# Patient Record
Sex: Male | Born: 1985 | Race: Black or African American | Hispanic: No | Marital: Single | Smoking: Former smoker
Health system: Southern US, Community
[De-identification: ages and names within clinical notes are randomized; demographics above are authoritative.]

## PROBLEM LIST (undated history)

## (undated) DIAGNOSIS — B977 Papillomavirus as the cause of diseases classified elsewhere: Secondary | ICD-10-CM

## (undated) DIAGNOSIS — I10 Essential (primary) hypertension: Secondary | ICD-10-CM

## (undated) DIAGNOSIS — J45909 Unspecified asthma, uncomplicated: Secondary | ICD-10-CM

## (undated) DIAGNOSIS — B20 Human immunodeficiency virus [HIV] disease: Secondary | ICD-10-CM

## (undated) DIAGNOSIS — D849 Immunodeficiency, unspecified: Secondary | ICD-10-CM

## (undated) DIAGNOSIS — I639 Cerebral infarction, unspecified: Secondary | ICD-10-CM

## (undated) DIAGNOSIS — I509 Heart failure, unspecified: Secondary | ICD-10-CM

---

## 2018-04-23 ENCOUNTER — Emergency Department (HOSPITAL_COMMUNITY): Payer: Medicaid Other

## 2018-04-23 ENCOUNTER — Encounter (HOSPITAL_COMMUNITY): Payer: Self-pay

## 2018-04-23 ENCOUNTER — Other Ambulatory Visit: Payer: Self-pay

## 2018-04-23 ENCOUNTER — Emergency Department (HOSPITAL_COMMUNITY)
Admission: EM | Admit: 2018-04-23 | Discharge: 2018-04-24 | Disposition: A | Discharge status: Home / Self Care | Payer: Medicaid Other | Attending: Emergency Medicine | Admitting: Emergency Medicine

## 2018-04-23 DIAGNOSIS — Z8673 Personal history of transient ischemic attack (TIA), and cerebral infarction without residual deficits: Secondary | ICD-10-CM | POA: Insufficient documentation

## 2018-04-23 DIAGNOSIS — F17210 Nicotine dependence, cigarettes, uncomplicated: Secondary | ICD-10-CM | POA: Insufficient documentation

## 2018-04-23 DIAGNOSIS — Z21 Asymptomatic human immunodeficiency virus [HIV] infection status: Secondary | ICD-10-CM | POA: Insufficient documentation

## 2018-04-23 DIAGNOSIS — I509 Heart failure, unspecified: Secondary | ICD-10-CM | POA: Insufficient documentation

## 2018-04-23 DIAGNOSIS — I110 Hypertensive heart disease with heart failure: Secondary | ICD-10-CM | POA: Insufficient documentation

## 2018-04-23 DIAGNOSIS — R079 Chest pain, unspecified: Secondary | ICD-10-CM | POA: Diagnosis present

## 2018-04-23 DIAGNOSIS — R0789 Other chest pain: Secondary | ICD-10-CM | POA: Diagnosis not present

## 2018-04-23 DIAGNOSIS — J45909 Unspecified asthma, uncomplicated: Secondary | ICD-10-CM | POA: Insufficient documentation

## 2018-04-23 DIAGNOSIS — R112 Nausea with vomiting, unspecified: Secondary | ICD-10-CM | POA: Diagnosis not present

## 2018-04-23 HISTORY — DX: Immunodeficiency, unspecified: D84.9

## 2018-04-23 HISTORY — DX: Essential (primary) hypertension: I10

## 2018-04-23 HISTORY — DX: Cerebral infarction, unspecified: I63.9

## 2018-04-23 HISTORY — DX: Human immunodeficiency virus (HIV) disease: B20

## 2018-04-23 HISTORY — DX: Unspecified asthma, uncomplicated: J45.909

## 2018-04-23 HISTORY — DX: Papillomavirus as the cause of diseases classified elsewhere: B97.7

## 2018-04-23 HISTORY — DX: Heart failure, unspecified: I50.9

## 2018-04-23 LAB — CBC WITH DIFFERENTIAL/PLATELET
Abs Immature Granulocytes: 0.01 10*3/uL (ref 0.00–0.07)
Basophils Absolute: 0 10*3/uL (ref 0.0–0.1)
Basophils Relative: 1 %
Eosinophils Absolute: 0 10*3/uL (ref 0.0–0.5)
Eosinophils Relative: 0 %
HCT: 37 % — ABNORMAL LOW (ref 39.0–52.0)
Hemoglobin: 11.9 g/dL — ABNORMAL LOW (ref 13.0–17.0)
Immature Granulocytes: 0 %
Lymphocytes Relative: 41 %
Lymphs Abs: 1.7 10*3/uL (ref 0.7–4.0)
MCH: 27.8 pg (ref 26.0–34.0)
MCHC: 32.2 g/dL (ref 30.0–36.0)
MCV: 86.4 fL (ref 80.0–100.0)
MONOS PCT: 6 %
Monocytes Absolute: 0.2 10*3/uL (ref 0.1–1.0)
Neutro Abs: 2.2 10*3/uL (ref 1.7–7.7)
Neutrophils Relative %: 52 %
PLATELETS: 231 10*3/uL (ref 150–400)
RBC: 4.28 MIL/uL (ref 4.22–5.81)
RDW: 13.8 % (ref 11.5–15.5)
WBC: 4.2 10*3/uL (ref 4.0–10.5)
nRBC: 0 % (ref 0.0–0.2)

## 2018-04-23 LAB — BASIC METABOLIC PANEL
Anion gap: 11 (ref 5–15)
BUN: 8 mg/dL (ref 6–20)
CO2: 23 mmol/L (ref 22–32)
Calcium: 9.9 mg/dL (ref 8.9–10.3)
Chloride: 105 mmol/L (ref 98–111)
Creatinine, Ser: 1.1 mg/dL (ref 0.61–1.24)
GFR calc Af Amer: >60 mL/min (ref >60)
GFR calc non Af Amer: >60 mL/min (ref >60)
Glucose, Bld: 75 mg/dL (ref 70–99)
Potassium: 3.3 mmol/L — ABNORMAL LOW (ref 3.5–5.1)
Sodium: 139 mmol/L (ref 135–145)

## 2018-04-23 LAB — TROPONIN I: Troponin I: <0.03 ng/mL (ref <0.03)

## 2018-04-23 MED ORDER — APIXABAN 5 MG PO TABS
5.0000 mg | ORAL_TABLET | Freq: Once | ORAL | Status: AC
  Administered 2018-04-24: 5 mg via ORAL
  Filled 2018-04-23: qty 1

## 2018-04-23 MED ORDER — LOSARTAN POTASSIUM 50 MG PO TABS
25.0000 mg | ORAL_TABLET | Freq: Once | ORAL | Status: AC
  Administered 2018-04-23: 25 mg via ORAL
  Filled 2018-04-23: qty 1

## 2018-04-23 MED ORDER — IBUPROFEN 800 MG PO TABS
800.0000 mg | ORAL_TABLET | Freq: Once | ORAL | Status: AC
  Administered 2018-04-23: 800 mg via ORAL
  Filled 2018-04-23: qty 1

## 2018-04-23 MED ORDER — ASPIRIN EC 81 MG PO TBEC
81.0000 mg | DELAYED_RELEASE_TABLET | Freq: Once | ORAL | Status: AC
  Administered 2018-04-23: 81 mg via ORAL
  Filled 2018-04-23: qty 1

## 2018-04-23 MED ORDER — IBUPROFEN 800 MG PO TABS: 800.0000 mg | ORAL_TABLET | Freq: Three times a day (TID) | ORAL | 0 refills | Status: AC | PRN

## 2018-04-23 MED ORDER — SODIUM CHLORIDE 0.9 % IV BOLUS
1000.0000 mL | Freq: Once | INTRAVENOUS | Status: AC
  Administered 2018-04-23: 1000 mL via INTRAVENOUS

## 2018-04-23 MED ORDER — TRAZODONE HCL 50 MG PO TABS
150.0000 mg | ORAL_TABLET | Freq: Every day | ORAL | Status: DC
  Administered 2018-04-23: 150 mg via ORAL
  Filled 2018-04-23: qty 3

## 2018-04-23 MED ORDER — NALOXONE HCL 2 MG/2ML IJ SOSY
1.0000 mg | PREFILLED_SYRINGE | Freq: Once | INTRAMUSCULAR | Status: AC
  Administered 2018-04-23: 1 mg via INTRAVENOUS
  Filled 2018-04-23: qty 2

## 2018-04-23 MED ORDER — METOPROLOL TARTRATE 25 MG PO TABS
50.0000 mg | ORAL_TABLET | Freq: Once | ORAL | Status: AC
  Administered 2018-04-23: 50 mg via ORAL
  Filled 2018-04-23: qty 2

## 2018-04-23 NOTE — Discharge Instructions (Addendum)
Follow up if not improving

## 2018-04-23 NOTE — ED Provider Notes (Signed)
[facility] EMERGENCY DEPARTMENT Provider Note   CSN: [id number] Arrival date & time: 04/23/18  1951    History   Chief Complaint Chief Complaint  Patient presents with  . Chest Pain    HPI Tyshaun Bilbrey is a 33 y.o. male.     Patient complains of chest discomfort.  He has no cough no fever.  The history is provided by the patient. No language interpreter was used.  Chest Pain  Pain location:  Substernal area Pain quality: aching   Pain radiates to:  Does not radiate Pain severity:  Moderate Onset quality:  Sudden Timing:  Constant Progression:  Worsening Chronicity:  New Context: not breathing   Relieved by:  Nothing Exacerbated by: Movement. Associated symptoms: no abdominal pain, no back pain, no cough, no fatigue and no headache     Past Medical History:  Diagnosis Date  . Asthma   . CHF (congestive heart failure) (HCC)   . HIV (human immunodeficiency virus infection) (HCC)   . HPV (human papilloma virus) infection   . Hypertension   . Immune deficiency disorder (HCC)   . Stroke [facility])     There are no active problems to display for this patient.   History reviewed. No pertinent surgical history.      Home Medications    Prior to Admission medications   Medication Sig Start Date End Date Taking? Authorizing Provider  ibuprofen (ADVIL,MOTRIN) 800 MG tablet Take 1 tablet (800 mg total) by mouth every 8 (eight) hours as needed for moderate pain. 04/23/18   Bethann Berkshire, MD    Family History History reviewed. No pertinent family history.  Social History Social History   Tobacco Use  . Smoking status: Current Every Day Smoker    Packs/day: 0.50    Years: 15.00    Pack years: 7.50    Types: Cigarettes  . Smokeless tobacco: Never Used  Substance Use Topics  . Alcohol use: Not Currently  . 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   Review of  Systems Review of Systems  Constitutional: Negative for appetite change and fatigue.  HENT: Negative for congestion, ear discharge and sinus pressure.   Eyes: Negative for discharge.  Respiratory: Negative for cough.   Cardiovascular: Positive for chest pain.  Gastrointestinal: Negative for abdominal pain and diarrhea.  Genitourinary: Negative for frequency and hematuria.  Musculoskeletal: Negative for back pain.  Skin: Negative for rash.  Neurological: Negative for seizures and headaches.  Psychiatric/Behavioral: Negative for hallucinations.     Physical Exam Updated Vital Signs BP 111/67   Pulse 94   Temp 98.4 F (36.9 C) (Oral)   Resp 15   Ht 5\' 8"  (1.727 m)   Wt 81.6 kg   SpO2 (!) 69%   BMI 27.37 kg/m   Physical Exam Vitals signs and nursing note reviewed.  Constitutional:      Appearance: He is well-developed.  HENT:     Head: Normocephalic.     Nose: Nose normal.  Eyes:     General: No scleral icterus.    Conjunctiva/sclera: Conjunctivae normal.  Neck:     Musculoskeletal: Neck supple.     Thyroid: No thyromegaly.  Cardiovascular:     Rate and Rhythm: Normal rate and regular rhythm.     Heart sounds: No murmur. No friction rub. No gallop.   Pulmonary:     Breath sounds: No stridor.  No wheezing or rales.  Chest:     Chest wall: Tenderness present.  Abdominal:     General: There is no distension.     Tenderness: There is no abdominal tenderness. There is no rebound.  Musculoskeletal: Normal range of motion.  Lymphadenopathy:     Cervical: No cervical adenopathy.  Skin:    Findings: No erythema or rash.  Neurological:     Mental Status: He is alert and oriented to person, place, and time.     Motor: No abnormal muscle tone.     Coordination: Coordination normal.  Psychiatric:        Behavior: Behavior normal.      ED Treatments / Results  Labs (all labs ordered are listed, but only abnormal results are displayed) Labs Reviewed  CBC WITH  DIFFERENTIAL/PLATELET - Abnormal; Notable for the following components:      Result Value   Hemoglobin 11.9 (*)    HCT 37.0 (*)    All other components within normal limits  BASIC METABOLIC PANEL - Abnormal; Notable for the following components:   Potassium 3.3 (*)    All other components within normal limits  TROPONIN I  RAPID URINE DRUG SCREEN, HOSP PERFORMED    EKG EKG Interpretation  Date/Time:  Sunday April 23 2018 20:11:29 EDT Ventricular Rate:  90 PR Interval:    QRS Duration: 97 QT Interval:  362 QTC Calculation: 443 R Axis:   80 Text Interpretation:  Sinus rhythm ST elev, probable normal early repol pattern Confirmed by Oberia Beaudoin ([id number]) on 04/23/2018 10:29:14 PM   Radiology Dg Chest Port 1 View  Result Date: 04/23/2018 CLINICAL DATA:  Chest pain. EXAM: PORTABLE CHEST 1 VIEW COMPARISON:  None. FINDINGS: The heart size and mediastinal contours are within normal limits. Both lungs are clear. The visualized skeletal structures are unremarkable. IMPRESSION: No active disease. Electronically Signed   By: David  Williams III M.D   On: 04/23/2018 21:08    Procedures Procedures (including critical care time)  Medications Ordered in ED Medications  ibuprofen (ADVIL,MOTRIN) tablet 800 mg (800 mg Oral Given 04/23/18 2042)  sodium chloride 0.9 % bolus 1,000 mL (1,000 mLs Intravenous New Bag/Given 04/23/18 2047)  naloxone (NARCAN) injection 1 mg (1 mg Intravenous Given 04/23/18 2042)     Initial Impression / Assessment and Plan / ED Course  I have reviewed the triage vital signs and the nursing notes.  Pertinent labs & imaging results that were available during my care of the patient were reviewed by me and considered in my medical decision making (see chart for details).        Labs EKG troponin all unremarkable.  Chest x-ray normal.  Patient with atypical chest pain will give Motrin and follow-up Final Clinical Impressions(s) / ED Diagnoses   Final diagnoses:   Atypical chest pain    ED Discharge Orders         Ordered    ibuprofen (ADVIL,MOTRIN) 800 MG tablet  Every 8 hours PRN     03 /29/20 2256           Bethann Berkshire, MD 04/23/18 2300

## 2018-04-23 NOTE — ED Triage Notes (Signed)
Pt rode bus here from [place]. Bus drove away with luggage when pt reached [place]. Pt started having panic attack alone with chest pain to left chest radiating to left arm. Numbess and tingling to BUE and BLE. Pt with history of MI and stroke

## 2018-04-24 ENCOUNTER — Emergency Department (HOSPITAL_COMMUNITY)
Admission: EM | Admit: 2018-04-24 | Discharge: 2018-04-24 | Disposition: A | Discharge status: Home / Self Care | Payer: Medicaid Other | Attending: Emergency Medicine | Admitting: Emergency Medicine

## 2018-04-24 ENCOUNTER — Other Ambulatory Visit: Payer: Self-pay

## 2018-04-24 ENCOUNTER — Emergency Department (HOSPITAL_COMMUNITY): Payer: Medicaid Other

## 2018-04-24 ENCOUNTER — Encounter (HOSPITAL_COMMUNITY): Payer: Self-pay | Admitting: Student

## 2018-04-24 DIAGNOSIS — I110 Hypertensive heart disease with heart failure: Secondary | ICD-10-CM | POA: Diagnosis not present

## 2018-04-24 DIAGNOSIS — Z8673 Personal history of transient ischemic attack (TIA), and cerebral infarction without residual deficits: Secondary | ICD-10-CM | POA: Insufficient documentation

## 2018-04-24 DIAGNOSIS — F17210 Nicotine dependence, cigarettes, uncomplicated: Secondary | ICD-10-CM | POA: Insufficient documentation

## 2018-04-24 DIAGNOSIS — B20 Human immunodeficiency virus [HIV] disease: Secondary | ICD-10-CM | POA: Insufficient documentation

## 2018-04-24 DIAGNOSIS — R1030 Lower abdominal pain, unspecified: Secondary | ICD-10-CM | POA: Diagnosis not present

## 2018-04-24 DIAGNOSIS — I509 Heart failure, unspecified: Secondary | ICD-10-CM | POA: Diagnosis not present

## 2018-04-24 DIAGNOSIS — J45909 Unspecified asthma, uncomplicated: Secondary | ICD-10-CM | POA: Insufficient documentation

## 2018-04-24 DIAGNOSIS — Z86718 Personal history of other venous thrombosis and embolism: Secondary | ICD-10-CM | POA: Diagnosis not present

## 2018-04-24 DIAGNOSIS — Z7901 Long term (current) use of anticoagulants: Secondary | ICD-10-CM | POA: Diagnosis not present

## 2018-04-24 DIAGNOSIS — R197 Diarrhea, unspecified: Secondary | ICD-10-CM | POA: Diagnosis not present

## 2018-04-24 DIAGNOSIS — R112 Nausea with vomiting, unspecified: Secondary | ICD-10-CM

## 2018-04-24 LAB — URINALYSIS, COMPLETE (UACMP) WITH MICROSCOPIC
BILIRUBIN URINE: NEGATIVE
Bacteria, UA: NONE SEEN
Glucose, UA: NEGATIVE mg/dL
Hgb urine dipstick: NEGATIVE
Ketones, ur: NEGATIVE mg/dL
Leukocytes,Ua: NEGATIVE
Nitrite: NEGATIVE
Protein, ur: NEGATIVE mg/dL
Specific Gravity, Urine: 1.028 (ref 1.005–1.030)
pH: 6 (ref 5.0–8.0)

## 2018-04-24 LAB — RAPID URINE DRUG SCREEN, HOSP PERFORMED
Amphetamines: POSITIVE — AB
Barbiturates: NOT DETECTED
Benzodiazepines: NOT DETECTED
Cocaine: NOT DETECTED
Opiates: NOT DETECTED
Tetrahydrocannabinol: NOT DETECTED

## 2018-04-24 LAB — CBC WITH DIFFERENTIAL/PLATELET
Abs Immature Granulocytes: 0 10*3/uL (ref 0.00–0.07)
Basophils Absolute: 0 10*3/uL (ref 0.0–0.1)
Basophils Relative: 1 %
Eosinophils Absolute: 0 10*3/uL (ref 0.0–0.5)
Eosinophils Relative: 1 %
HCT: 34.7 % — ABNORMAL LOW (ref 39.0–52.0)
Hemoglobin: 11.4 g/dL — ABNORMAL LOW (ref 13.0–17.0)
Immature Granulocytes: 0 %
Lymphocytes Relative: 33 %
Lymphs Abs: 1.1 10*3/uL (ref 0.7–4.0)
MCH: 29.3 pg (ref 26.0–34.0)
MCHC: 32.9 g/dL (ref 30.0–36.0)
MCV: 89.2 fL (ref 80.0–100.0)
Monocytes Absolute: 0.3 10*3/uL (ref 0.1–1.0)
Monocytes Relative: 8 %
NRBC: 0 % (ref 0.0–0.2)
Neutro Abs: 2 10*3/uL (ref 1.7–7.7)
Neutrophils Relative %: 57 %
Platelets: 204 10*3/uL (ref 150–400)
RBC: 3.89 MIL/uL — ABNORMAL LOW (ref 4.22–5.81)
RDW: 14.1 % (ref 11.5–15.5)
WBC: 3.4 10*3/uL — ABNORMAL LOW (ref 4.0–10.5)

## 2018-04-24 LAB — COMPREHENSIVE METABOLIC PANEL
ALT: 23 U/L (ref 0–44)
AST: 35 U/L (ref 15–41)
Albumin: 3.8 g/dL (ref 3.5–5.0)
Alkaline Phosphatase: 65 U/L (ref 38–126)
Anion gap: 5 (ref 5–15)
BUN: 11 mg/dL (ref 6–20)
CO2: 24 mmol/L (ref 22–32)
CREATININE: 1.13 mg/dL (ref 0.61–1.24)
Calcium: 8.8 mg/dL — ABNORMAL LOW (ref 8.9–10.3)
Chloride: 110 mmol/L (ref 98–111)
GFR calc non Af Amer: >60 mL/min (ref >60)
Glucose, Bld: 104 mg/dL — ABNORMAL HIGH (ref 70–99)
Potassium: 3.7 mmol/L (ref 3.5–5.1)
Sodium: 139 mmol/L (ref 135–145)
Total Bilirubin: 0.6 mg/dL (ref 0.3–1.2)
Total Protein: 6 g/dL — ABNORMAL LOW (ref 6.5–8.1)

## 2018-04-24 LAB — LACTIC ACID, PLASMA
Lactic Acid, Venous: 2.8 mmol/L (ref 0.5–1.9)
Lactic Acid, Venous: 1 mmol/L (ref 0.5–1.9)

## 2018-04-24 LAB — SALICYLATE LEVEL: Salicylate Lvl: <7 mg/dL (ref 2.8–30.0)

## 2018-04-24 LAB — CBG MONITORING, ED: Glucose-Capillary: 93 mg/dL (ref 70–99)

## 2018-04-24 LAB — LACTATE DEHYDROGENASE: LDH: 161 U/L (ref 98–192)

## 2018-04-24 LAB — ACETAMINOPHEN LEVEL: Acetaminophen (Tylenol), Serum: <10 ug/mL — ABNORMAL LOW (ref 10–30)

## 2018-04-24 LAB — ETHANOL

## 2018-04-24 MED ORDER — IOHEXOL 300 MG/ML  SOLN
100.0000 mL | Freq: Once | INTRAMUSCULAR | Status: AC | PRN
  Administered 2018-04-24: 100 mL via INTRAVENOUS

## 2018-04-24 MED ORDER — SODIUM CHLORIDE 0.9 % IV BOLUS
1000.0000 mL | Freq: Once | INTRAVENOUS | Status: AC
  Administered 2018-04-24: 1000 mL via INTRAVENOUS

## 2018-04-24 MED ORDER — DICYCLOMINE HCL 20 MG PO TABS: 20.0000 mg | ORAL_TABLET | Freq: Three times a day (TID) | ORAL | 0 refills | Status: AC | PRN

## 2018-04-24 MED ORDER — ONDANSETRON 4 MG PO TBDP: 4.0000 mg | ORAL_TABLET | Freq: Three times a day (TID) | ORAL | 0 refills | Status: AC | PRN

## 2018-04-24 MED ORDER — ONDANSETRON HCL 4 MG/2ML IJ SOLN
4.0000 mg | Freq: Once | INTRAMUSCULAR | Status: AC
  Administered 2018-04-24: 4 mg via INTRAVENOUS
  Filled 2018-04-24: qty 2

## 2018-04-24 MED ORDER — DICYCLOMINE HCL 10 MG/ML IM SOLN
20.0000 mg | Freq: Once | INTRAMUSCULAR | Status: AC
  Administered 2018-04-24: 20 mg via INTRAMUSCULAR
  Filled 2018-04-24: qty 2

## 2018-04-24 NOTE — ED Notes (Signed)
CBG 93 

## 2018-04-24 NOTE — Discharge Instructions (Signed)
You are seen in the emergency department today for nausea, vomiting, diarrhea, and abdominal pain.  Your work-up was overall reassuring.  Your imaging did not show any specific acute abnormalities.  We are sending you home with Zofran and antinausea medication to help with nausea and vomiting as well as Bentyl a medicine for abdominal discomfort to take as needed for abdominal cramping.  We have prescribed you new medication(s) today. Discuss the medications prescribed today with your pharmacist as they can have adverse effects and interactions with your other medicines including over the counter and prescribed medications. Seek medical evaluation if you start to experience new or abnormal symptoms after taking one of these medicines, seek care immediately if you start to experience difficulty breathing, feeling of your throat closing, facial swelling, or rash as these could be indications of a more serious allergic reaction  Please be sure to take all of your medications as prescribed that were given after discharge from the hospital.  Please be sure to follow-up with primary care within 3 days.  Return to the ER for new or worsening symptoms or any other concerns.

## 2018-04-24 NOTE — ED Notes (Signed)
PT states understanding of care given, follow up care, and medication prescribed. PT ambulated from ED to car with a steady gait. 

## 2018-04-24 NOTE — ED Notes (Signed)
Pt given water to drink per PO challenge order

## 2018-04-24 NOTE — ED Provider Notes (Signed)
[facility] EMERGENCY DEPARTMENT Provider Note   CSN: [id number] Arrival date & time: 04/24/18  0900    History   Chief Complaint Chief Complaint  Patient presents with   Emesis   Abdominal Pain   Shortness of Breath    HPI Loma MessingWillie Iannelli is a 33 y.o. 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 Patient reports he is primarily here for N/V/D with associated abdominal pain which began this AM. He notes 3 episodes of emesis & 4-5 episodes of diarrhea associated with lower abdominal discomfort that is constant. No blood in diarrhea or emesis, no mucous either. No alleviating/aggravating factors. No suspicious PO intake.Marland Kitchen. He notes over the past few hours he has also felt somewhat short of breath which he attributes to an asthma issue. No alleviating/aggravating factors. No associated chest pain, cough, fever, or wheezing. He did recently come from [place]. No known covid 19 exposure. No international travel.   He was recently hospitalized for stroke sxs in [place] 03/25. Available records were reviewed through care everywhere. He states he came to [place] to see his brother and came down sometime this weekend. He is an overall poor historian, disoriented to time & place, oriented to person. History somewhat limited due to AMS. He states not taking his medicines consistently the past couple of days.     HPI  Past Medical History:  Diagnosis Date   Asthma    CHF (congestive heart failure) (HCC)    HIV (human immunodeficiency virus infection) (HCC)    HPV (human papilloma virus) infection    Hypertension    Immune deficiency disorder (HCC)    Stroke (HCC)     There are no active problems to display for this patient.   No past surgical history on file.      Home Medications     Prior to Admission medications   Medication Sig Start Date End Date Taking? Authorizing Provider  ibuprofen (ADVIL,MOTRIN) 800 MG tablet Take 1 tablet (800 mg total) by mouth every 8 (eight) hours as needed for moderate pain. 04/23/18   Bethann BerkshireZammit, Joseph, MD    Family History No family history on file.  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   Review of Systems Review of Systems  Constitutional: Negative for chills and fever.  HENT: Negative for congestion, ear pain and sore throat.   Respiratory: Positive for shortness of breath. Negative for cough and wheezing.   Cardiovascular: Negative for chest pain.  Gastrointestinal: Positive for abdominal pain, diarrhea, nausea and vomiting. Negative for anal bleeding, blood in stool and constipation.  Genitourinary: Negative for dysuria.  Neurological: Negative for syncope.  All other systems reviewed and are negative.  Physical Exam Updated Vital Signs BP (!) 142/88 (BP Location: Right Arm)    Pulse (!) 112    Temp 98.3 F (36.8 C) (Oral)  Resp 18    SpO2 98%   Physical Exam Vitals signs and nursing note reviewed.  Constitutional:      General: He is not in acute distress.    Appearance: He is well-developed. He is not toxic-appearing.  HENT:     Head: Normocephalic and atraumatic.  Eyes:     General:        Right eye: No discharge.        Left eye: No discharge.     Extraocular Movements: Extraocular movements intact.     Conjunctiva/sclera: Conjunctivae normal.  Neck:     Musculoskeletal: Normal range of motion and neck supple. No edema, neck rigidity or crepitus.  Cardiovascular:     Rate  and Rhythm: Normal rate and regular rhythm.  Pulmonary:     Effort: Pulmonary effort is normal. No respiratory distress.     Breath sounds: Normal breath sounds. No wheezing, rhonchi or rales.  Abdominal:     General: There is no distension.     Palpations: Abdomen is soft.     Tenderness: There is abdominal tenderness (lower abdomen). There is no guarding or rebound.  Skin:    General: Skin is warm and dry.     Findings: No rash.  Neurological:     Mental Status: He is alert.     Comments: Clear speech.  CN III through XII grossly intact.  Patient does have some mild weakness to the LUE/LLE in comparison to RUE/RLE, does not appear to be acute today per patient and upon chart review. Negative pronator drift. Normal finger to nose. Ambulatory. Oriented to person (name DOB). In terms of place- states we are in [place], [place]. In terms of time- states it is June 2022 and that Fay Records is the president.   Psychiatric:        Behavior: Behavior normal.      ED Treatments / Results  Labs (all labs ordered are listed, but only abnormal results are displayed) Labs Reviewed  COMPREHENSIVE METABOLIC PANEL - Abnormal; Notable for the following components:      Result Value   Glucose, Bld 104 (*)    Calcium 8.8 (*)    Total Protein 6.0 (*)    All other components within normal limits  CBC WITH DIFFERENTIAL/PLATELET - Abnormal; Notable for the following components:   WBC 3.4 (*)    RBC 3.89 (*)    Hemoglobin 11.4 (*)    HCT 34.7 (*)    All other components within normal limits  URINALYSIS, COMPLETE (UACMP) WITH MICROSCOPIC - Abnormal; Notable for the following components:   APPearance HAZY (*)    All other components within normal limits  LACTIC ACID, PLASMA - Abnormal; Notable for the following components:   Lactic Acid, Venous 2.8 (*)    All other components within normal limits  RAPID URINE DRUG SCREEN, HOSP PERFORMED - Abnormal; Notable for the following components:   Amphetamines  POSITIVE (*)    All other components within normal limits  ACETAMINOPHEN LEVEL - Abnormal; Notable for the following components:   Acetaminophen (Tylenol), Serum <10 (*)    All other components within normal limits  CULTURE, BLOOD (ROUTINE X 2)  CULTURE, BLOOD (ROUTINE X 2)  URINE CULTURE  ETHANOL  LACTATE DEHYDROGENASE  SALICYLATE LEVEL  LACTIC ACID, PLASMA  LACTIC ACID, PLASMA  CBG MONITORING, ED    EKG None  Radiology Ct Head Wo Contrast  Result Date: 04/24/2018 CLINICAL DATA:  Altered mental status. EXAM: CT HEAD WITHOUT CONTRAST  TECHNIQUE: Contiguous axial images were obtained from the base of the skull through the vertex without intravenous contrast. COMPARISON:  None. FINDINGS: Brain: No evidence of acute infarction, hemorrhage, hydrocephalus, extra-axial collection or mass lesion/mass effect. Vascular: No hyperdense vessel or unexpected calcification. Skull: Normal. Negative for fracture or focal lesion. Sinuses/Orbits: No acute finding. Other: None. IMPRESSION: Normal head CT. Electronically Signed   By: Lupita Raider, M.D.   On: 04/24/2018 12:18   Ct Abdomen Pelvis W Contrast  Result Date: 04/24/2018 CLINICAL DATA:  Pt c/o sob and vomiting x 3 that began this am; denies sore throat, cough; endorses CP yesterday but not today; endorses diarrhea x 5 today accompanied by lower abdominal pain 8/10; pt from [place], pt states he arrived in [place] this past Saturday; alert and oriented to self, place, situation; disoriented to time ; pt states he has not been taking his meds; denies drug/etoh use EXAM: CT ABDOMEN AND PELVIS WITH CONTRAST TECHNIQUE: Multidetector CT imaging of the abdomen and pelvis was performed using the standard protocol following bolus administration of intravenous contrast. CONTRAST:  OMNIPAQUE IOHEXOL 300 MG/ML  SOLN COMPARISON:  None. FINDINGS: Lower chest: Clear lung bases.  Heart normal in size. Hepatobiliary: No focal liver abnormality is seen. No gallstones,  gallbladder wall thickening, or biliary dilatation. Pancreas: Unremarkable. No pancreatic ductal dilatation or surrounding inflammatory changes. Spleen: Normal in size without focal abnormality. Adrenals/Urinary Tract: No adrenal masses. Kidneys are normal in size, orientation and position. No renal masses or convincing stones. No hydronephrosis. Normal ureters. Bladder is mildly distended. Wall is prominent, most likely due to lack of distension. Consider cystitis if there are consistent clinical findings. No bladder mass or stone. Stomach/Bowel: Stomach is within normal limits. Appendix appears normal. No evidence of bowel wall thickening, distention, or inflammatory changes. Vascular/Lymphatic: No significant vascular findings are present. No enlarged abdominal or pelvic lymph nodes. Reproductive: Unremarkable. Other: No abdominal wall hernia or abnormality. No abdominopelvic ascites. Musculoskeletal: Chronic bilateral pars defects at L5-S1 with a grade 1 anterolisthesis. No acute fracture. No bone lesion. IMPRESSION: 1. No acute findings within the abdomen or pelvis. No bowel obstruction or inflammation. 2. Chronic bilateral pars defects at L5-S1 with a grade 1 anterolisthesis. Exam otherwise unremarkable. Electronically Signed   By: Amie Portland M.D.   On: 04/24/2018 12:25   Dg Chest Port 1 View  Result Date: 04/24/2018 CLINICAL DATA:  33 year old male with a history of GI symptoms EXAM: PORTABLE CHEST 1 VIEW COMPARISON:  None. FINDINGS: Cardiomediastinal silhouette unchanged in size and contour. No evidence of central vascular congestion. No pneumothorax or pleural effusion. Low lung volumes persist. No confluent airspace disease. No displaced fracture IMPRESSION: Low lung volumes without evidence of acute cardiopulmonary disease Electronically Signed   By: Gilmer Mor D.O.   On: 04/24/2018 10:55   Dg Chest Port 1 View  Result Date: 04/23/2018 CLINICAL DATA:  Chest pain. EXAM: PORTABLE CHEST 1 VIEW  COMPARISON:  None. FINDINGS: The heart size and mediastinal contours are within normal limits. Both lungs are clear. The visualized skeletal structures are unremarkable. IMPRESSION: No active disease. Electronically Signed   By: Gerome Sam III M.D   On: 04/23/2018 21:08    Procedures Procedures (including critical care time)  Medications Ordered in ED Medications  sodium chloride 0.9 % bolus 1,000 mL (0 mLs Intravenous Stopped 04/24/18 1256)  iohexol (OMNIPAQUE) 300 MG/ML solution 100 mL (100 mLs Intravenous Contrast Given 04/24/18 1213)  ondansetron (ZOFRAN) injection 4 mg (4 mg Intravenous  Given 04/24/18 1257)  dicyclomine (BENTYL) injection 20 mg (20 mg Intramuscular Given 04/24/18 1259)     Initial Impression / Assessment and Plan / ED Course  I have reviewed the triage vital signs and the nursing notes.  Pertinent labs & imaging results that were available during my care of the patient were reviewed by me and considered in my medical decision making (see chart for details).   Patient presents to the ED for N/V/D & abdominal pain with brief mention of dyspnea. Patient nontoxic appearing, initial tachycardia resolved on my exam & w/ repeat vitals. Exam with lower abdominal tenderness without peritoneal signs. Of notes patient seems altered- he is able to hold an appropriate conversation with me, but is not completely oriented to place/time. Plan for broad workup including labs & imaging. Rectal temp afebrile.   Work-up reviewed:  CBC: mild leukopenia @ 3.4 decreased from 3.96 on record. Anemia is consistent w/ prior ranges. CMP: Mild hypocalcemia @ 8.8- electrolytes otherwise WNL. Creatinine WNL. No anion gap elevation or acidosis.  UA: Negative. No UTI.  UDS: Positive for amphetamines.  Tox screens otherwise negative (salicylate, acetaminophen).  Ethanol WNL.  LDH: WNL.  Initial lactic acid: elevated @ 2.8- plan for fluids & re-assessment.   CXR: Low lung volumes otherwise  unremarkable- no infiltrate, pneumothorax, effusion, or edema.  CT head: Negative CT abdomen/pelvis: no acute abnormalities.   Patient with overall reassuring work-up in the emergency department. - Regarding his nausea, vomiting, diarrhea, and abdominal pain: CT abdomen pelvis with contrast is reassuring, no acute process.  Emesis/diarrhea have both not been bloody.  No international travel to raise concern for traveler's diarrhea.  Symptoms improved with Zofran and Bentyl in the emergency department, he has been able to tolerate p.o, repeat abdominal exam remains without peritoneal signs.  At this time do not suspect appendicitis, cholecystitis, pancreatitis, perforation, obstruction, or other acute surgical process.  Seems viral in nature.   - Regarding his dyspnea: Patient's lungs are clear to auscultation bilaterally, no wheezing to suggest asthma exacerbation requiring steroids.  His chest x-ray is without infiltrate, pneumothorax, effusion, or edema.  Patient is from [place], his dyspnea seems like it was transient, he is not having fever or cough, no known exposures, does not seem consistent with [phone] feel this is less likely. While he does have a history of prior pulmonary embolism, he informed me this does not feel the same, he has not been compliant with his medicines for the past couple of days but prior to this was taking his anticoagulation as prescribed.  He is not hypoxic or tachycardic.  Maintained SpO2 > 95% w/ ambulation. Overall does not seem consistent with PE at this time. His dyspnea resolved quickly throughout ER stay.   - Regarding initial disorientation- Unclear definitive etiology, was able to communicate appropriately throughout stay, disorientation to time/place resolved while in the ER.  On reassessment patient is alert and oriented x4.  He has no acute focal neurologic deficits, he does have baseline left-sided weakness which he reports is unchanged. Resolved throughout  emergency department stay.   At this time will discharge home with supportive care.  I discussed the importance of taking all of his prescribed medications as written and following up very closely with primary care provider.  Patient states he has a place a place to stay and feels comfortable with this plan. I discussed results, treatment plan, need for follow-up, and return precautions with the patient. Provided opportunity for questions, patient confirmed  understanding and is in agreement with plan.    This is a shared visit with supervising physician Dr. Jeraldine Loots who has independently evaluated patient & provided guidance in evaluation/management/disposition, in agreement with care   Vitals:   04/24/18 1530 04/24/18 1614  BP: 106/61 104/61  Pulse: 70 93  Resp: 15 16  Temp:    SpO2: 98% 99%    Final Clinical Impressions(s) / ED Diagnoses   Final diagnoses:  Nausea vomiting and diarrhea    ED Discharge Orders         Ordered    ondansetron (ZOFRAN ODT) 4 MG disintegrating tablet  Every 8 hours PRN     04/24/18 1547    dicyclomine (BENTYL) 20 MG tablet  Every 8 hours PRN     04/24/18 1547           Carrol Bondar, Pleas Koch, PA-C 04/24/18 1640    Gerhard Munch, MD 04/25/18 [phone]

## 2018-04-24 NOTE — ED Notes (Signed)
Dr. Jeraldine Loots notified of elevated lactic

## 2018-04-24 NOTE — ED Triage Notes (Signed)
Blood clot behind left knee with shortness of breath. HIV positive

## 2018-04-24 NOTE — ED Triage Notes (Signed)
Pt c/o sob and vomiting x 3 that began this am; denies sore throat, cough; endorses CP yesterday but not today; endorses diarrhea x 5 today accompanied by lower abdominal pain 8/10; pt from [place], pt states he arrived in [place] this past Saturday; alert and oriented to self, place, situation; disoriented to time ; pt states he has not been taking his meds; denies drug/etoh use

## 2018-04-25 LAB — URINE CULTURE: Culture: <10000 — AB

## 2018-04-29 LAB — CULTURE, BLOOD (ROUTINE X 2)
Culture: NO GROWTH
Culture: NO GROWTH
Special Requests: ADEQUATE

## 2020-02-20 IMAGING — CT CT HEAD WITHOUT CONTRAST
3 series · 15 of 47 positions shown, 18 images · non-contrast
Comparison: None.

CLINICAL DATA: Altered mental status.

EXAM:
CT HEAD WITHOUT CONTRAST
TECHNIQUE: Contiguous axial images were obtained from the base of the skull
through the vertex without intravenous contrast.

[Series 3: head 5.0 h30s · axial · 0.39mm/px · z∈[-86,+49]mm · 9 of 33 slices shown, 12 images]
[im 3/33  brain]
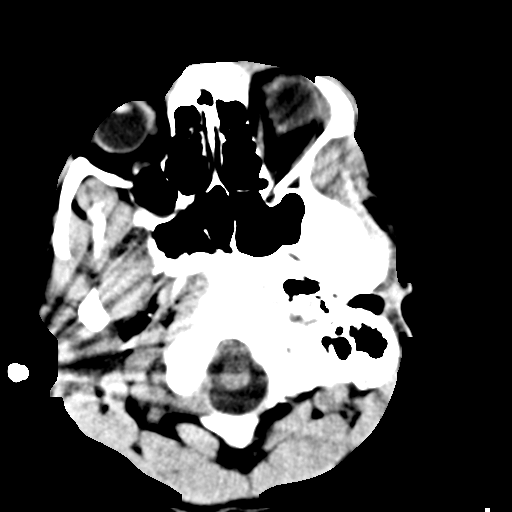
[im 3/33  bone]
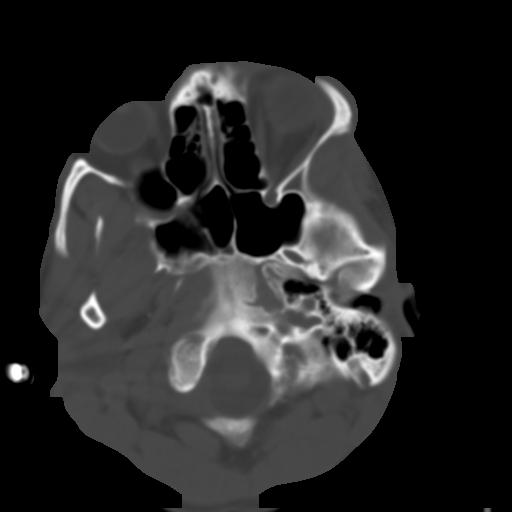
[im 6/33  brain]
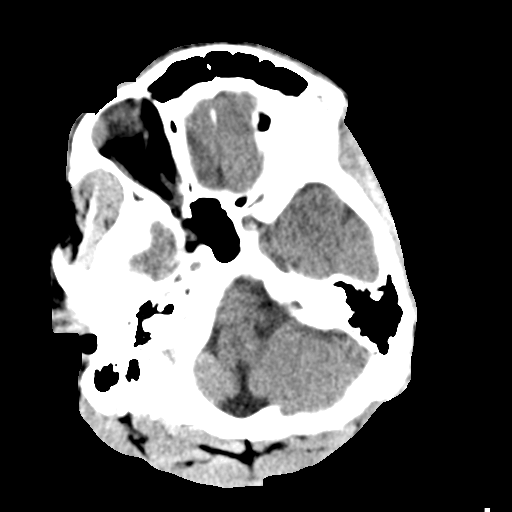
[im 9/33  brain]
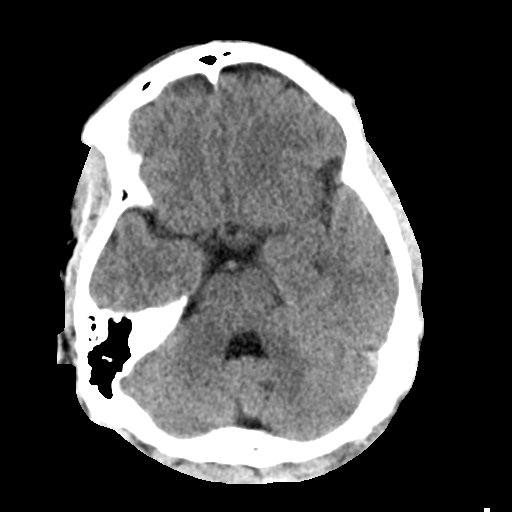
[im 13/33  brain]
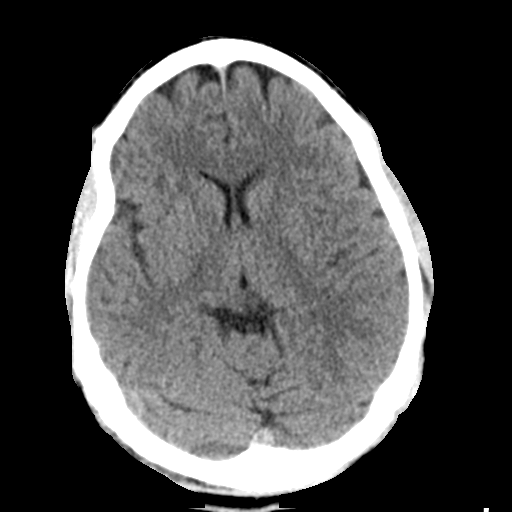
[im 17/33  brain]
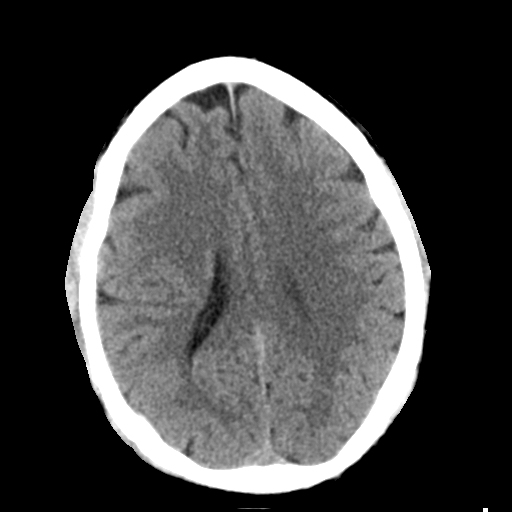
[im 17/33  bone]
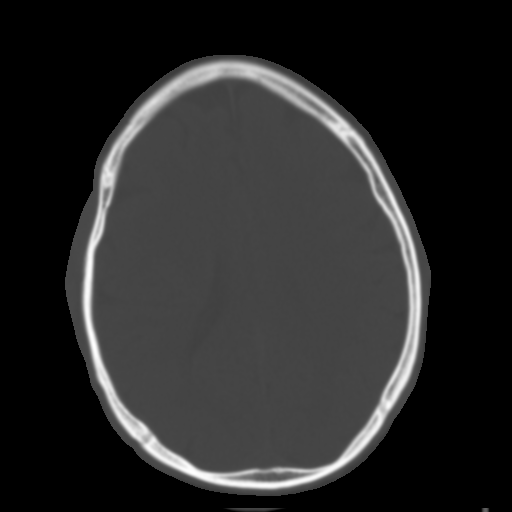
[im 20/33  brain]
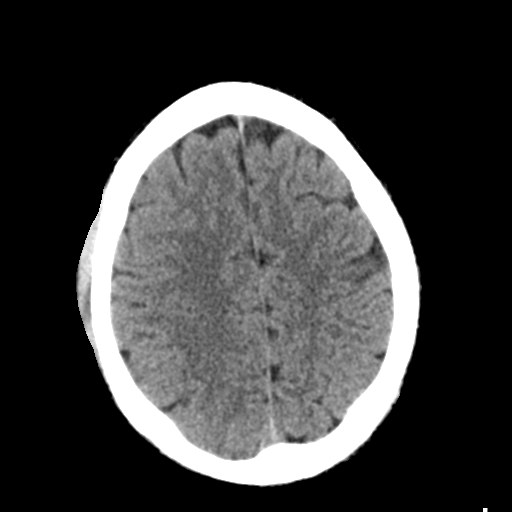
[im 24/33  brain]
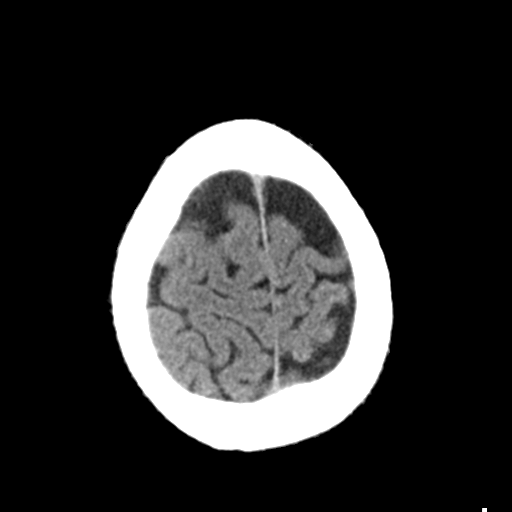
[im 27/33  brain]
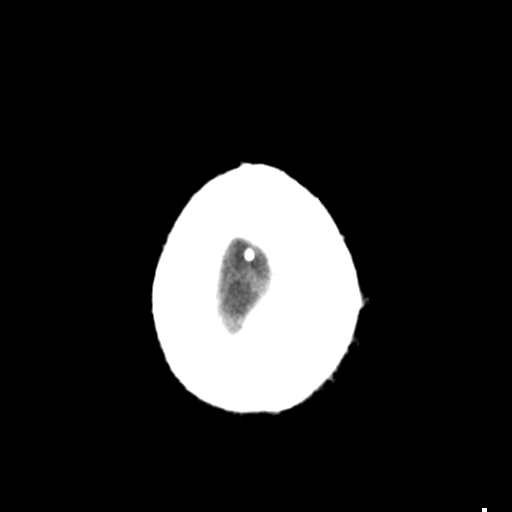
[im 30/33  brain]
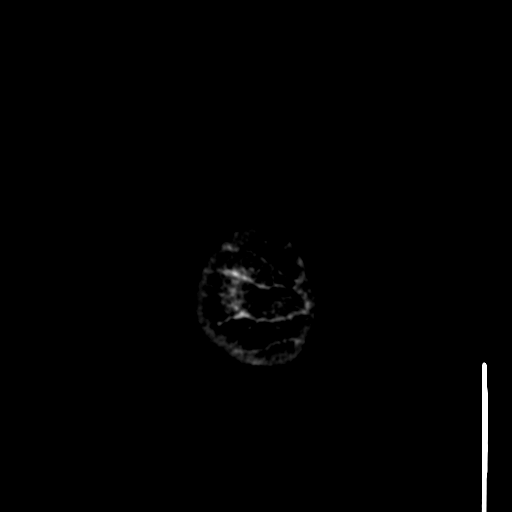
[im 30/33  bone]
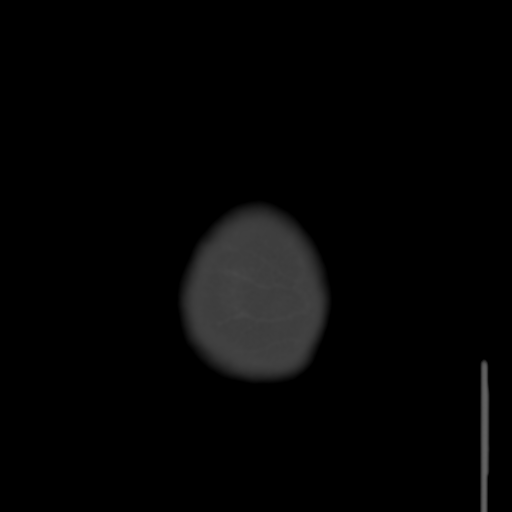

[Series 5: head 3.0 mpr cor · coronal · 0.30mm/px · 3 of 67 slices shown]
[im 23/67  brain]
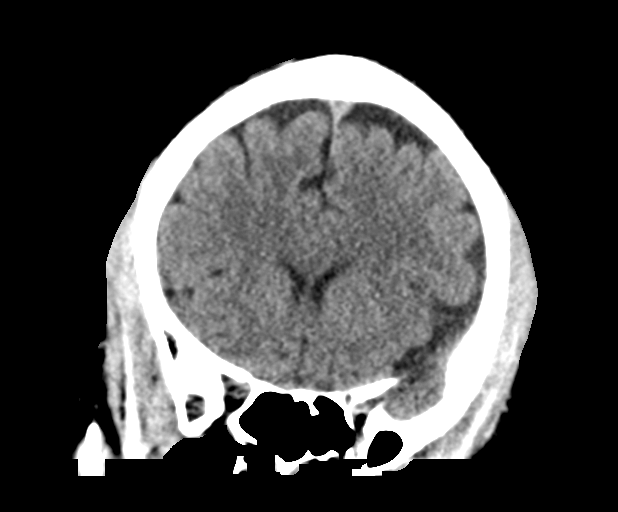
[im 30/67  brain]
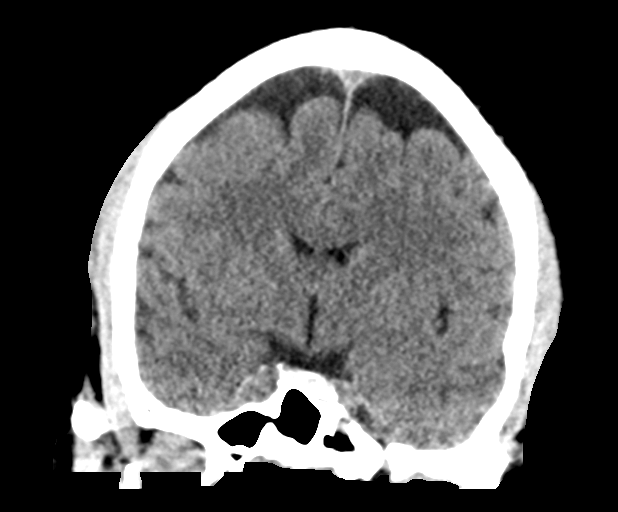
[im 37/67  brain]
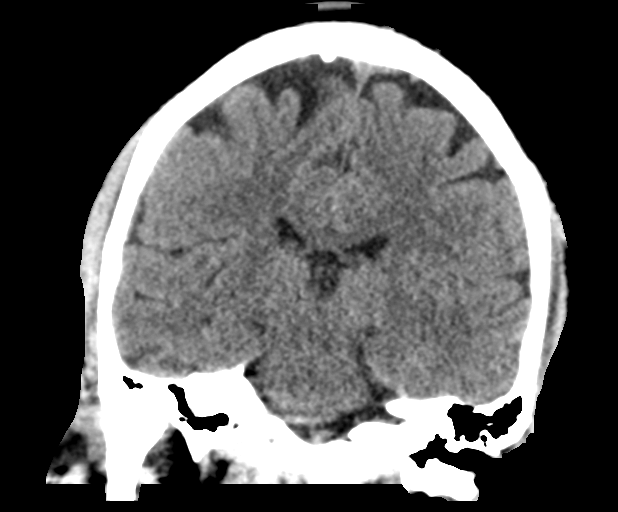

[Series 6: head 3.0 mpr sag · sagittal · 0.30mm/px · 3 of 57 slices shown]
[im 19/57  brain]
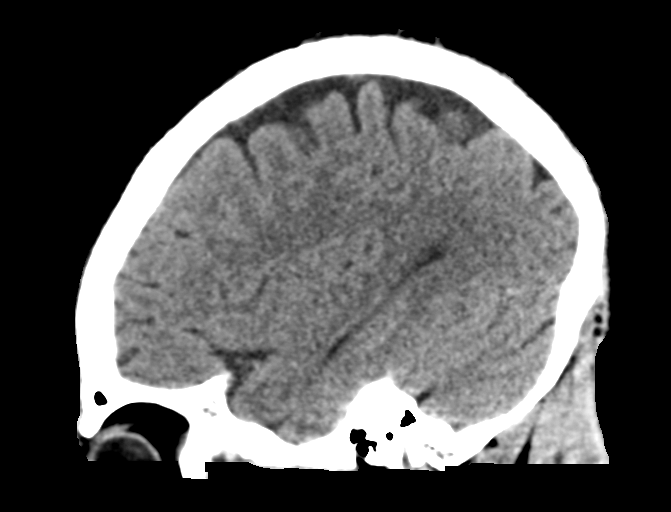
[im 29/57  brain]
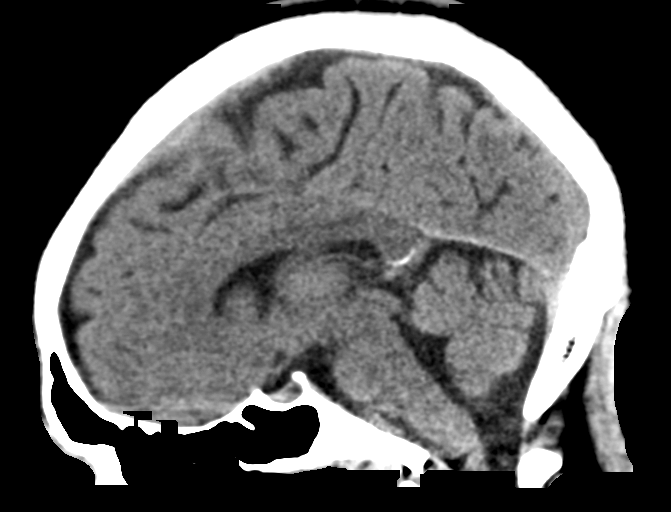
[im 38/57  brain]
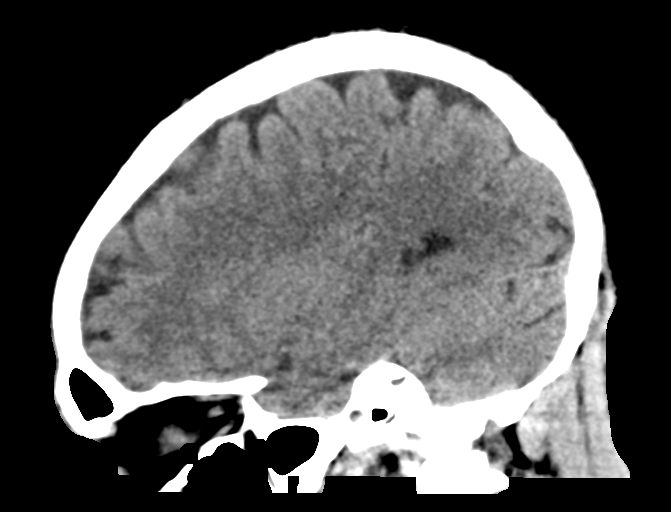

[15 of 47 positions shown; findings below may reference images not displayed]

FINDINGS: Brain: No evidence of acute infarction, hemorrhage, hydrocephalus,
extra-axial collection or mass lesion/mass effect.

Vascular: No hyperdense vessel or unexpected calcification.

Skull: Normal. Negative for fracture or focal lesion.

Sinuses/Orbits: No acute finding.

Other: None.
IMPRESSION: Normal head CT.
